# Patient Record
Sex: Female | Born: 1970 | Race: Black or African American | Hispanic: No | Marital: Married | Smoking: Current every day smoker
Health system: Southern US, Community
[De-identification: ages and names within clinical notes are randomized; demographics above are authoritative.]

## PROBLEM LIST (undated history)

## (undated) HISTORY — PX: PARTIAL HYSTERECTOMY: SHX80

## (undated) HISTORY — PX: ABDOMINAL EXPLORATION SURGERY: SHX538

---

## 1998-03-12 ENCOUNTER — Emergency Department (HOSPITAL_COMMUNITY): Admission: EM | Admit: 1998-03-12 | Discharge: 1998-03-13 | Payer: Self-pay | Admitting: Emergency Medicine

## 1998-03-13 ENCOUNTER — Emergency Department (HOSPITAL_COMMUNITY): Admission: EM | Admit: 1998-03-13 | Discharge: 1998-03-13 | Payer: Self-pay | Admitting: Emergency Medicine

## 1998-05-15 ENCOUNTER — Emergency Department (HOSPITAL_COMMUNITY): Admission: EM | Admit: 1998-05-15 | Discharge: 1998-05-15 | Payer: Self-pay | Admitting: Emergency Medicine

## 1999-06-17 ENCOUNTER — Other Ambulatory Visit: Admission: RE | Admit: 1999-06-17 | Discharge: 1999-06-17 | Payer: Self-pay | Admitting: Obstetrics and Gynecology

## 2014-04-05 ENCOUNTER — Emergency Department (HOSPITAL_COMMUNITY): Payer: No Typology Code available for payment source

## 2014-04-05 ENCOUNTER — Emergency Department (HOSPITAL_COMMUNITY)
Admission: EM | Admit: 2014-04-05 | Discharge: 2014-04-05 | Disposition: A | Discharge status: Home / Self Care | Payer: No Typology Code available for payment source | Attending: Emergency Medicine | Admitting: Emergency Medicine

## 2014-04-05 ENCOUNTER — Encounter (HOSPITAL_COMMUNITY): Payer: Self-pay | Admitting: Emergency Medicine

## 2014-04-05 DIAGNOSIS — F17200 Nicotine dependence, unspecified, uncomplicated: Secondary | ICD-10-CM | POA: Insufficient documentation

## 2014-04-05 DIAGNOSIS — IMO0002 Reserved for concepts with insufficient information to code with codable children: Secondary | ICD-10-CM | POA: Diagnosis present

## 2014-04-05 DIAGNOSIS — S8990XA Unspecified injury of unspecified lower leg, initial encounter: Secondary | ICD-10-CM | POA: Insufficient documentation

## 2014-04-05 DIAGNOSIS — Y92410 Unspecified street and highway as the place of occurrence of the external cause: Secondary | ICD-10-CM | POA: Insufficient documentation

## 2014-04-05 DIAGNOSIS — S99919A Unspecified injury of unspecified ankle, initial encounter: Secondary | ICD-10-CM

## 2014-04-05 DIAGNOSIS — S6990XA Unspecified injury of unspecified wrist, hand and finger(s), initial encounter: Secondary | ICD-10-CM | POA: Diagnosis not present

## 2014-04-05 DIAGNOSIS — Y9389 Activity, other specified: Secondary | ICD-10-CM | POA: Diagnosis not present

## 2014-04-05 DIAGNOSIS — S335XXA Sprain of ligaments of lumbar spine, initial encounter: Secondary | ICD-10-CM | POA: Diagnosis not present

## 2014-04-05 DIAGNOSIS — S298XXA Other specified injuries of thorax, initial encounter: Secondary | ICD-10-CM | POA: Insufficient documentation

## 2014-04-05 DIAGNOSIS — M25561 Pain in right knee: Secondary | ICD-10-CM

## 2014-04-05 DIAGNOSIS — M79642 Pain in left hand: Secondary | ICD-10-CM

## 2014-04-05 DIAGNOSIS — M79641 Pain in right hand: Secondary | ICD-10-CM

## 2014-04-05 DIAGNOSIS — S99929A Unspecified injury of unspecified foot, initial encounter: Secondary | ICD-10-CM

## 2014-04-05 DIAGNOSIS — S139XXA Sprain of joints and ligaments of unspecified parts of neck, initial encounter: Secondary | ICD-10-CM | POA: Diagnosis not present

## 2014-04-05 MED ORDER — HYDROCODONE-ACETAMINOPHEN 5-325 MG PO TABS: 1.0000 | ORAL_TABLET | Freq: Four times a day (QID) | ORAL | Status: AC | PRN

## 2014-04-05 MED ORDER — DIAZEPAM 5 MG PO TABS: 5.0000 mg | ORAL_TABLET | Freq: Two times a day (BID) | ORAL | Status: AC

## 2014-04-05 NOTE — ED Provider Notes (Signed)
Medical screening examination/treatment/procedure(s) were performed by non-physician practitioner and as supervising physician I was immediately available for consultation/collaboration.   EKG Interpretation None      Devoria Albe, MD, Armando Gang   Ward Givens, MD 04/05/14 2256

## 2014-04-05 NOTE — Progress Notes (Signed)
[facility] Community Liaison Stacy, ° °Provided pt with a list of self-pay providers to help patient establish primary care.  °
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
## 2014-05-10 ENCOUNTER — Ambulatory Visit: Payer: No Typology Code available for payment source | Attending: Family Medicine

## 2014-05-10 ENCOUNTER — Ambulatory Visit: Payer: No Typology Code available for payment source

## 2014-05-10 DIAGNOSIS — M545 Low back pain: Secondary | ICD-10-CM | POA: Insufficient documentation

## 2014-05-10 DIAGNOSIS — Z5189 Encounter for other specified aftercare: Secondary | ICD-10-CM | POA: Insufficient documentation

## 2014-05-23 ENCOUNTER — Ambulatory Visit: Payer: No Typology Code available for payment source | Attending: Family Medicine

## 2014-05-23 DIAGNOSIS — M545 Low back pain, unspecified: Secondary | ICD-10-CM

## 2014-05-23 DIAGNOSIS — Z5189 Encounter for other specified aftercare: Secondary | ICD-10-CM | POA: Diagnosis present
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
## 2014-05-23 NOTE — Patient Instructions (Signed)
Elbow Prop (Extension)   Prop body up on elbows for __30__ seconds. Slowly lower it. Repeat __3__ times. Do __3__ sessions per day.  http://gt2.exer.us/243   Copyright  VHI. All rights reserved.    Extension   Lie face down, hands close to chest. Press trunk up, arching back. Keep neck long, shoulders down. Tighten buttocks to protect lower back. Hold __3__ seconds. Repeat __5__ times. So 2 sets.  Do __3__ sessions per day.  Copyright  VHI. All rights reserved.    Backward Bend (Standing)   Arch backward to make hollow of back deeper. Hold _3___ seconds. Repeat __10__ times per set. Do _1___ sets per session. Do _3___ sessions per day.  http://orth.exer.us/178   Copyright  VHI. All rights reserved.

## 2014-05-26 ENCOUNTER — Ambulatory Visit: Payer: No Typology Code available for payment source | Admitting: Physical Therapy

## 2014-05-26 DIAGNOSIS — M545 Low back pain, unspecified: Secondary | ICD-10-CM

## 2014-05-26 DIAGNOSIS — Z5189 Encounter for other specified aftercare: Secondary | ICD-10-CM | POA: Diagnosis not present
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
## 2014-05-29 ENCOUNTER — Ambulatory Visit: Payer: No Typology Code available for payment source

## 2014-05-29 DIAGNOSIS — Z5189 Encounter for other specified aftercare: Secondary | ICD-10-CM | POA: Diagnosis not present

## 2014-05-29 DIAGNOSIS — M545 Low back pain, unspecified: Secondary | ICD-10-CM
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
## 2014-05-31 ENCOUNTER — Ambulatory Visit: Payer: No Typology Code available for payment source

## 2014-05-31 DIAGNOSIS — M545 Low back pain, unspecified: Secondary | ICD-10-CM

## 2014-05-31 DIAGNOSIS — Z5189 Encounter for other specified aftercare: Secondary | ICD-10-CM | POA: Diagnosis not present
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
## 2014-06-05 ENCOUNTER — Ambulatory Visit: Payer: No Typology Code available for payment source

## 2014-06-05 DIAGNOSIS — Z5189 Encounter for other specified aftercare: Secondary | ICD-10-CM | POA: Diagnosis not present

## 2014-06-05 DIAGNOSIS — M545 Low back pain, unspecified: Secondary | ICD-10-CM
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
## 2014-06-14 ENCOUNTER — Ambulatory Visit: Payer: No Typology Code available for payment source | Attending: Family Medicine | Admitting: Physical Therapy

## 2014-06-14 DIAGNOSIS — M545 Low back pain, unspecified: Secondary | ICD-10-CM

## 2014-06-14 DIAGNOSIS — Z5189 Encounter for other specified aftercare: Secondary | ICD-10-CM | POA: Diagnosis present

## 2014-06-14 NOTE — Therapy (Signed)
Outpatient Rehabilitation [facility] [address] [place], [place], [address] Phone: [phone]   Fax:  [phone]  Physical Therapy Treatment  Patient Details  Name: Judith Alvarez MRN: [id number] Date of Birth: 04-21-1971  Encounter Date: 06/14/2014      PT End of Session - 06/14/14 1255    Visit Number 7   Number of Visits 12   Date for PT Re-Evaluation 06/20/14   PT Start Time 0935   PT Stop Time 1033   PT Time Calculation (min) 58 min   Activity Tolerance Patient tolerated treatment well      No past medical history on file.  Past Surgical History  Procedure Laterality Date  . Abdominal exploration surgery    . Partial hysterectomy      There were no vitals taken for this visit.  Visit Diagnosis:  Left-sided low back pain without sciatica  Midline low back pain without sciatica      Subjective Assessment - 06/14/14 0939    Symptoms 4/10.  [place], constant , no leg pain today. Back constant.  Getting up from sitting painful.   Pain Score 4    Pain Location Back   Pain Orientation Posterior   Pain Descriptors / Indicators Aching   Aggravating Factors  weather, rain.   Pain Relieving Factors Standing back extension, heating pad.   Multiple Pain Sites No            [facility] Adult PT Treatment/Exercise - 06/14/14 0956    Posture/Postural Control   Posture Comments --  Patient able to demonstrate good posture techniques with sim   Lumbar Exercises: Stretches   Standing Extension 5 reps;10 seconds   Prone on Elbows Stretch 1 rep  rewiewed   Lumbar Exercises: Supine   Bridge 10 reps;2 seconds  added bridge series to home exercise.  55 different exercise   Modalities   Modalities Moist Heat  to back, prone   Ultrasound   Ultrasound Location --  Patient declined   Manual Therapy   Manual Therapy --  patient declined          PT Education - 06/14/14 1000    Education provided Yes   Education Details Bridge series for home  exercise added to program.. Previous exercises rewiewed with patient demonstrating understanding.   Person(s) Educated Patient   Methods Explanation;Demonstration;Handout   Comprehension Verbalized understanding          PT Short Term Goals - 06/14/14 0943    PT SHORT TERM GOAL #1   Title be indep with initial HEP   Time 2   Period Weeks   Status Achieved   PT SHORT TERM GOAL #2   Title report decreased pain to <5/10   Time 2   Period Weeks   Status Achieved          PT Long Term Goals - 06/14/14 0947    PT LONG TERM GOAL #1   Title Demonstrate and/or verbalize techniques to reduce the risk of re-injury to include info on: posture and body mechanics   Period Weeks   Status Achieved   PT LONG TERM GOAL #2   Title be indep with advanced  HEP   Status Achieved   PT LONG TERM GOAL #3   Title report pain decrease to <3/10   Status Not Met   PT LONG TERM GOAL #4   Title tolerate sitting for >30 mins without increase in pain.    Status Achieved   PT LONG TERM  GOAL #5   Title rpeorts ability to perform cokking and pet responsibilities without increase in pain.   Time 6   Period Weeks   Status --          Plan - 06/14/14 1256    Clinical Impression Statement Patient made progress toward functional and pain goals.  See following sections for goals met and unmet.  She is happy with her progress and wished for Discharge from PT today.   PT Home Exercise Plan continue, stretch daily, strengthen 3 to 4 timesw a week.   Consulted and Agree with Plan of Care Patient         PHYSICAL THERAPY DISCHARGE SUMMARY  Visits from Start of Care: 7  Current functional level related to goals / functional outcomes: She has made progress but was not pain free . She asked for discharge.   Remaining deficits: See Goals. She continued with pain thugh her leg pain was improved and was more functional   Education / Equipment: HEP Plan: Patient agrees to discharge.  Patient goals  were partially met. Patient is being discharged due to being pleased with the current functional level.  ?????    .   Darrel Hoover, PT  .6:08 AM 06/15/14                    Problem List There are no active problems to display for this patient. Melvenia Needles, PTA 06/15/2014 8:35 AM Phone: [phone] Fax: [phone]   Darrel Hoover 06/15/2014, 8:35 AM

## 2014-06-14 NOTE — Patient Instructions (Addendum)
Bridge   Lie back, legs bent. Inhale, pressing hips up. Keeping ribs in, lengthen lower back. Exhale, rolling down along spine from top. Repeat __10__ times. Do _1___ sessions per day.  Copyright  [employer]. All rights reserved.  Also bridge with moving legs in and out, marching and straightening knee.  Standard handout issued.   Breath out with lift.
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
